# Patient Record
Sex: Female | Born: 1950 | Race: Black or African American | Hispanic: No | Marital: Married | Smoking: Former smoker
Health system: Southern US, Community
[De-identification: ages and names within clinical notes are randomized; demographics above are authoritative.]

## PROBLEM LIST (undated history)

## (undated) DIAGNOSIS — E039 Hypothyroidism, unspecified: Secondary | ICD-10-CM

## (undated) DIAGNOSIS — I82409 Acute embolism and thrombosis of unspecified deep veins of unspecified lower extremity: Secondary | ICD-10-CM

## (undated) DIAGNOSIS — T8859XA Other complications of anesthesia, initial encounter: Secondary | ICD-10-CM

## (undated) DIAGNOSIS — I10 Essential (primary) hypertension: Secondary | ICD-10-CM

## (undated) DIAGNOSIS — C801 Malignant (primary) neoplasm, unspecified: Secondary | ICD-10-CM

## (undated) DIAGNOSIS — K760 Fatty (change of) liver, not elsewhere classified: Secondary | ICD-10-CM

## (undated) DIAGNOSIS — E7800 Pure hypercholesterolemia, unspecified: Secondary | ICD-10-CM

## (undated) DIAGNOSIS — M75100 Unspecified rotator cuff tear or rupture of unspecified shoulder, not specified as traumatic: Secondary | ICD-10-CM

## (undated) DIAGNOSIS — R011 Cardiac murmur, unspecified: Secondary | ICD-10-CM

## (undated) DIAGNOSIS — J45909 Unspecified asthma, uncomplicated: Secondary | ICD-10-CM

## (undated) DIAGNOSIS — K5792 Diverticulitis of intestine, part unspecified, without perforation or abscess without bleeding: Secondary | ICD-10-CM

## (undated) DIAGNOSIS — K589 Irritable bowel syndrome without diarrhea: Secondary | ICD-10-CM

## (undated) DIAGNOSIS — K219 Gastro-esophageal reflux disease without esophagitis: Secondary | ICD-10-CM

## (undated) DIAGNOSIS — Z973 Presence of spectacles and contact lenses: Secondary | ICD-10-CM

## (undated) DIAGNOSIS — G4730 Sleep apnea, unspecified: Secondary | ICD-10-CM

## (undated) HISTORY — PX: APPENDECTOMY: SHX54

## (undated) HISTORY — PX: MULTIPLE TOOTH EXTRACTIONS: SHX2053

## (undated) HISTORY — PX: COLONOSCOPY: SHX174

---

## 2019-09-12 ENCOUNTER — Other Ambulatory Visit: Payer: Self-pay | Admitting: Family

## 2019-09-12 DIAGNOSIS — D242 Benign neoplasm of left breast: Secondary | ICD-10-CM

## 2019-09-12 DIAGNOSIS — R921 Mammographic calcification found on diagnostic imaging of breast: Secondary | ICD-10-CM

## 2019-09-12 DIAGNOSIS — N6452 Nipple discharge: Secondary | ICD-10-CM

## 2019-10-02 ENCOUNTER — Ambulatory Visit
Admission: RE | Admit: 2019-10-02 | Discharge: 2019-10-02 | Disposition: A | Discharge status: Home / Self Care | Payer: Medicare Other | Source: Ambulatory Visit | Attending: Family | Admitting: Family

## 2019-10-02 ENCOUNTER — Other Ambulatory Visit: Payer: Self-pay

## 2019-10-02 DIAGNOSIS — D242 Benign neoplasm of left breast: Secondary | ICD-10-CM

## 2019-10-02 DIAGNOSIS — R921 Mammographic calcification found on diagnostic imaging of breast: Secondary | ICD-10-CM

## 2019-10-02 DIAGNOSIS — N6452 Nipple discharge: Secondary | ICD-10-CM

## 2019-10-09 ENCOUNTER — Other Ambulatory Visit: Payer: Self-pay | Admitting: Surgery

## 2019-10-09 ENCOUNTER — Encounter: Payer: Self-pay | Admitting: Adult Health

## 2019-10-09 DIAGNOSIS — D0512 Intraductal carcinoma in situ of left breast: Secondary | ICD-10-CM

## 2019-10-09 DIAGNOSIS — D242 Benign neoplasm of left breast: Secondary | ICD-10-CM

## 2019-10-09 DIAGNOSIS — Z170 Estrogen receptor positive status [ER+]: Secondary | ICD-10-CM | POA: Insufficient documentation

## 2019-10-09 DIAGNOSIS — C50112 Malignant neoplasm of central portion of left female breast: Secondary | ICD-10-CM | POA: Insufficient documentation

## 2019-10-10 ENCOUNTER — Other Ambulatory Visit: Payer: Self-pay | Admitting: Surgery

## 2019-10-10 DIAGNOSIS — D242 Benign neoplasm of left breast: Secondary | ICD-10-CM

## 2019-10-10 DIAGNOSIS — D0512 Intraductal carcinoma in situ of left breast: Secondary | ICD-10-CM

## 2019-10-14 ENCOUNTER — Telehealth: Payer: Self-pay | Admitting: *Deleted

## 2019-10-14 NOTE — Telephone Encounter (Signed)
Spoke to pt regarding medical and radiation oncology. Pt would like to have a referral to medical and radiation oncology in [place], [place] or [place]. Msg sent to Dr. Trevor Mace nurse.

## 2019-10-15 ENCOUNTER — Telehealth: Payer: Self-pay | Admitting: *Deleted

## 2019-10-15 NOTE — Telephone Encounter (Signed)
Patient wishes to have treatment closer to home.

## 2019-10-31 NOTE — Pre-Procedure Instructions (Signed)
   Kristine Nguyen  10/31/2019      Your procedure is scheduled on Tuesday, November 05, 2019  Report to [facility] Admitting at 1:45 P.M.  Call this number if you have problems the morning of surgery:  [phone]   Remember: Brush your teeth the morning of surgery.   Do not eat after midnight the night before surgery.   You may drink clear liquids until 12:45P.M. the day of surgery. Clear liquids allowed are:  Water, Juice (non-citric and without pulp - diabetics please choose diet or no sugar options), Carbonated beverages - (diabetics please choose diet or no sugar options), Clear Tea, Black Coffee only (no creamer, milk or cream including half and half), Plain Jell-O only (diabetics please choose diet or no sugar options), Gatorade (diabetics please choose diet or no sugar options) and Plain Popsicles only Please finish you Ensure Pre-Surgery drink by 12:45P.M. (Do not sip).   Take these medicines the morning of surgery with A SIP OF WATER : amLODipine (NORVASC)  esomeprazole (NEXIUM)  levothyroxine (SYNTHROID)  metoprolol succinate (TOPROL-XL) rosuvastatin (CRESTOR)   If needed: albuterol (VENTOLIN HFA) 108 (90 Base) MCG/ACT inhaler for wheezing or shortness of breath ( Bring inhaler in with you on day of surgery)  Stop taking Aspirin (unless otherwise advised by surgeon), vitamins, fish oil and herbal medications. Do not take any NSAIDs ie: Ibuprofen, Advil, Naproxen (Aleve), Motrin, BC and Goody Powder; stop now.    Do not wear jewelry, make-up or nail polish.  Do not wear lotions, powders, or perfumes, or deodorant.  Do not shave 48 hours prior to surgery.   Do not bring valuables to the hospital.  [facility] is not responsible for any belongings or valuables.  Contacts, dentures or bridgework may not be worn into surgery.   Patients discharged the day of surgery will not be allowed to drive home.   Special instructions: See " [place] [facility] For [facility]"  sheet.   Please read over the following fact sheets that you were given. Pain Booklet, Coughing and Deep Breathing and Surgical Site Infection Prevention

## 2019-11-01 ENCOUNTER — Other Ambulatory Visit (HOSPITAL_COMMUNITY)
Admission: RE | Admit: 2019-11-01 | Discharge: 2019-11-01 | Disposition: A | Discharge status: Home / Self Care | Payer: BC Managed Care – PPO | Source: Ambulatory Visit | Attending: Surgery | Admitting: Surgery

## 2019-11-01 ENCOUNTER — Encounter (HOSPITAL_COMMUNITY): Payer: Self-pay

## 2019-11-01 ENCOUNTER — Other Ambulatory Visit: Payer: Self-pay

## 2019-11-01 ENCOUNTER — Encounter (HOSPITAL_COMMUNITY)
Admission: RE | Admit: 2019-11-01 | Discharge: 2019-11-01 | Disposition: A | Discharge status: Home / Self Care | Payer: BC Managed Care – PPO | Source: Ambulatory Visit | Attending: Surgery | Admitting: Surgery

## 2019-11-01 DIAGNOSIS — Z20822 Contact with and (suspected) exposure to covid-19: Secondary | ICD-10-CM | POA: Diagnosis not present

## 2019-11-01 DIAGNOSIS — Z01812 Encounter for preprocedural laboratory examination: Secondary | ICD-10-CM | POA: Diagnosis not present

## 2019-11-01 HISTORY — DX: Sleep apnea, unspecified: G47.30

## 2019-11-01 HISTORY — DX: Presence of spectacles and contact lenses: Z97.3

## 2019-11-01 HISTORY — DX: Fatty (change of) liver, not elsewhere classified: K76.0

## 2019-11-01 HISTORY — DX: Gastro-esophageal reflux disease without esophagitis: K21.9

## 2019-11-01 HISTORY — DX: Diverticulitis of intestine, part unspecified, without perforation or abscess without bleeding: K57.92

## 2019-11-01 HISTORY — DX: Cardiac murmur, unspecified: R01.1

## 2019-11-01 HISTORY — DX: Essential (primary) hypertension: I10

## 2019-11-01 HISTORY — DX: Hypothyroidism, unspecified: E03.9

## 2019-11-01 HISTORY — DX: Malignant (primary) neoplasm, unspecified: C80.1

## 2019-11-01 HISTORY — DX: Acute embolism and thrombosis of unspecified deep veins of unspecified lower extremity: I82.409

## 2019-11-01 HISTORY — DX: Other complications of anesthesia, initial encounter: T88.59XA

## 2019-11-01 HISTORY — DX: Unspecified asthma, uncomplicated: J45.909

## 2019-11-01 HISTORY — DX: Unspecified rotator cuff tear or rupture of unspecified shoulder, not specified as traumatic: M75.100

## 2019-11-01 HISTORY — DX: Pure hypercholesterolemia, unspecified: E78.00

## 2019-11-01 HISTORY — DX: Irritable bowel syndrome, unspecified: K58.9

## 2019-11-01 HISTORY — DX: Morbid (severe) obesity due to excess calories: E66.01

## 2019-11-01 LAB — BASIC METABOLIC PANEL
Anion gap: 10 (ref 5–15)
BUN: 21 mg/dL (ref 8–23)
CO2: 25 mmol/L (ref 22–32)
Calcium: 9.4 mg/dL (ref 8.9–10.3)
Chloride: 104 mmol/L (ref 98–111)
Creatinine, Ser: 1.14 mg/dL — ABNORMAL HIGH (ref 0.44–1.00)
GFR calc Af Amer: 57 mL/min — ABNORMAL LOW (ref >60)
GFR calc non Af Amer: 49 mL/min — ABNORMAL LOW (ref >60)
Glucose, Bld: 101 mg/dL — ABNORMAL HIGH (ref 70–99)
Potassium: 4 mmol/L (ref 3.5–5.1)
Sodium: 139 mmol/L (ref 135–145)

## 2019-11-01 LAB — CBC
HCT: 41.8 % (ref 36.0–46.0)
Hemoglobin: 13 g/dL (ref 12.0–15.0)
MCH: 28.1 pg (ref 26.0–34.0)
MCHC: 31.1 g/dL (ref 30.0–36.0)
MCV: 90.3 fL (ref 80.0–100.0)
Platelets: 292 10*3/uL (ref 150–400)
RBC: 4.63 MIL/uL (ref 3.87–5.11)
RDW: 15.5 % (ref 11.5–15.5)
WBC: 8.5 10*3/uL (ref 4.0–10.5)
nRBC: 0 % (ref 0.0–0.2)

## 2019-11-01 LAB — NO BLOOD PRODUCTS

## 2019-11-01 LAB — SARS CORONAVIRUS 2 (TAT 6-24 HRS): SARS Coronavirus 2: NEGATIVE

## 2019-11-01 NOTE — Progress Notes (Signed)
Pt denies having a cardiac cath. Pt denies having a chest x ray. Pt denies recent labs.

## 2019-11-01 NOTE — Progress Notes (Signed)
Pt denies any acute pulmonary issues. Pt denies having chest pain. Pt stated that she is under the care of Dr. Tresa Endo, PCP, Estanislado Pandy, NP, Cardiology and Dr. Clyda Hurdle, Pulmonology. Pt stated that Dr. Thomasene Mohair performed her sleep study. Nurse requested sleep study results and LOV notes ; awaiting response. Nurse requested EKG tracing and all cardiac studies from cardiologist; awaiting response. Pt stated that she is scheduled for an echo next month.  Pt made aware to continue to quarantine. Pt verbalized understanding of all pre-op instructions. PA, Anesthesiology, asked to review pt history.

## 2019-11-02 ENCOUNTER — Other Ambulatory Visit (HOSPITAL_COMMUNITY): Payer: Medicare Other

## 2019-11-04 MED ORDER — DEXTROSE 5 % IV SOLN
3.0000 g | INTRAVENOUS | Status: AC
  Administered 2019-11-05: 3 g via INTRAVENOUS
  Filled 2019-11-04: qty 3

## 2019-11-04 NOTE — Anesthesia Preprocedure Evaluation (Addendum)
Anesthesia Evaluation  Patient identified by MRN, date of birth, ID band Patient awake    Reviewed: Allergy & Precautions, NPO status , Patient's Chart, lab work & pertinent test results, reviewed documented beta blocker date and time   History of Anesthesia Complications Negative for: history of anesthetic complications  Airway Mallampati: II  TM Distance: >3 FB Neck ROM: Full    Dental  (+) Dental Advisory Given, Missing,    Pulmonary asthma , sleep apnea and Continuous Positive Airway Pressure Ventilation , former smoker,  Last used albuterol a few weeks ago Quit smoking 40 years ago   Pulmonary exam normal breath sounds clear to auscultation       Cardiovascular hypertension, Pt. on medications and Pt. on home beta blockers + DVT  Normal cardiovascular exam Rhythm:Regular Rate:Normal  HLD  EKG 08/19/19 (copy on chart): Sinus bradycardia. Rate 49. LAFB. Low voltage precordial leads. Diffuse nonspecific T abn.  Cardiac Event Monitor 08/22/2017-09/04/2017 Normal Sinus Rhythm, No SVT. No Ventricular arrhythmias. No pauses. Average HR 57, Maximum HR 107, Minimum HR 41.  Echo 08/26/16 1. Resting bradycardia (HR<60bpm).  2. The left ventricle is mildly dilated.  3. Overall left ventricular systolic function is normal with EF 60 - 65 %.  4. The left atrium is mildly dilated.  5. There is trace mitral regurgitation.  6. Mild tricuspid regurgitation present.  7. The right ventricular systolic pressure, as measured by Doppler, is 35.59mmHg.   US Carotid 08/26/16 IMPRESSION: Negative for carotid artery stenosis.   Echocardiogram 01/30/13 Technical difficult study 2. Left ventricle mildly dilated 3. Overall Left ventricular systolic function is normal with EF between 55-60% 4. Diastolic filling pattern normal for age of pt. 5. Left atrium is moderately dilated 6. Trace Mitral regurgitation  Nuc Stress Test 01/30/13 1.  Myocardial imaging consistent with artifact  2. LVEF is normal calcualted at 69%, Wall motion is normal 3. Low risk Test    Neuro/Psych negative neurological ROS  negative psych ROS   GI/Hepatic Neg liver ROS, GERD  Medicated and Controlled,  Endo/Other  Hypothyroidism Morbid obesity (BMI 62)  Renal/GU negative Renal ROS  negative genitourinary   Musculoskeletal negative musculoskeletal ROS (+)   Abdominal (+) + obese,   Peds  Hematology negative hematology ROS (+)   Anesthesia Other Findings Left breast CA  Reproductive/Obstetrics                          Anesthesia Physical Anesthesia Plan  ASA: III  Anesthesia Plan: General   Post-op Pain Management:    Induction: Intravenous  PONV Risk Score and Plan: 3 and Ondansetron, Dexamethasone and Midazolam  Airway Management Planned: LMA  Additional Equipment: None  Intra-op Plan:   Post-operative Plan: Extubation in OR  Informed Consent: I have reviewed the patients History and Physical, chart, labs and discussed the procedure including the risks, benefits and alternatives for the proposed anesthesia with the patient or authorized representative who has indicated his/her understanding and acceptance.     Dental advisory given  Plan Discussed with: CRNA  Anesthesia Plan Comments:       Anesthesia Quick Evaluation

## 2019-11-04 NOTE — H&P (Signed)
Kristine Nguyen Appointment: 10/09/2019 2:50 PM Location: [facility] Patient #: [id number] DOB: Oct 08, 1950 Married / Language: English / Race: Black or African American Female   History of Present Illness ([name] A. [name] MD; 10/09/2019 2:31 PM) The patient is a 69 year old female who presents with breast cancer.  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 2 areas of femoral screening mammography which were biopsied. One showed low-grade DCIS which was 100% ER/PR positive. The other showed a papilloma. She denies nipple discharge. She has had no previous history of breast biopsies. Her family history is negative for breast cancer. She is otherwise without complaints.   Diagnostic Studies History ([name], [name]; 10/09/2019 2:05 PM) Colonoscopy  1-5 years ago Mammogram  within last year  Allergies ([name], [name]; 10/09/2019 2:06 PM) Advil *ANALGESICS - ANTI-INFLAMMATORY*  Aleve *ANALGESICS - ANTI-INFLAMMATORY*  Ibuprofen & Diet Manage Prod *ANALGESICS - ANTI-INFLAMMATORY*  Allergies Reconciled   Medication History ([name], CMA; 10/09/2019 2:07 PM) Rosuvastatin Calcium (20MG  Tablet, Oral) Active. Metoprolol Succinate ER (25MG  Tablet ER 24HR, Oral) Active. Levothyroxine Sodium (100MCG Tablet, Oral) Active. Lisinopril-hydroCHLOROthiazide (20-25MG  Tablet, Oral) Active. amLODIPine Besylate (10MG  Tablet, Oral) Active. Aspirin (81MG  Tablet, Oral) Active. Medications Reconciled  Social History [name], CMA; 10/09/2019 2:05 PM) No alcohol use  No caffeine use  No drug use  Tobacco use  Never smoker.  Family History [name], [name]; 10/09/2019 2:05 PM) Arthritis  Father, Mother. Diabetes Mellitus  Mother. Heart Disease  Mother. Hypertension  Mother. Thyroid problems   Brother.  Pregnancy / Birth History [name], [place]; 10/09/2019 2:05 PM) Age at menarche  78 years. Age of menopause  36-55 Gravida  2 Maternal age  81-25 Para  2  Other Problems ([name], [name]; 10/09/2019 2:05 PM) Arthritis  Asthma  Colon Cancer  Gastroesophageal Reflux Disease  Hypercholesterolemia  Sleep Apnea  Thyroid Disease     Review of Systems ([name] CMA; 10/09/2019 2:05 PM) General Not Present- Appetite Loss, Chills, Fatigue, Fever, Night Sweats, Weight Gain and Weight Loss. Skin Not Present- Change in Wart/Mole, Dryness, Hives, Jaundice, New Lesions, Non-Healing Wounds, Rash and Ulcer. HEENT Not Present- Earache, Hearing Loss, Hoarseness, Nose Bleed, Oral Ulcers, Ringing in the Ears, Seasonal Allergies, Sinus Pain, Sore Throat, Visual Disturbances, Wears glasses/contact lenses and Yellow Eyes. Respiratory Not Present- Bloody sputum, Chronic Cough, Difficulty Breathing, Snoring and Wheezing. Breast Not Present- Breast Mass, Breast Pain, Nipple Discharge and Skin Changes. Cardiovascular Not Present- Chest Pain, Difficulty Breathing Lying Down, Leg Cramps, Palpitations, Rapid Heart Rate, Shortness of Breath and Swelling of Extremities. Gastrointestinal Not Present- Abdominal Pain, Bloating, Bloody Stool, Change in Bowel Habits, Chronic diarrhea, Constipation, Difficulty Swallowing, Excessive gas, Gets full quickly at meals, Hemorrhoids, Indigestion, Nausea, Rectal Pain and Vomiting. Female Genitourinary Not Present- Frequency, Nocturia, Painful Urination, Pelvic Pain and Urgency. Musculoskeletal Not Present- Back Pain, Joint Pain, Joint Stiffness, Muscle Pain, Muscle Weakness and Swelling of Extremities. Neurological Not Present- Decreased Memory, Fainting, Headaches, Numbness, Seizures, Tingling, Tremor, Trouble walking and Weakness. Psychiatric Not Present- Anxiety, Bipolar, Change in Sleep Pattern, Depression, Fearful and Frequent crying. Endocrine Not  Present- Cold Intolerance, Excessive Hunger, Hair Changes, Heat Intolerance, Hot flashes and New Diabetes. Hematology Not Present- Blood Thinners, Easy Bruising, Excessive bleeding, Gland problems, HIV and Persistent Infections.  Vitals ([name] CMA; 10/09/2019 2:07 PM) 10/09/2019 2:07 PM Weight: 348.38  lb Height: 63in Body Surface Area: 2.45 m Body Mass Index: 61.71 kg/m  Temp.: 97.9F  Pulse: 68 (Regular)        Physical Exam ([name] A. [name] MD; 10/09/2019 2:31 PM) The physical exam findings are as follows: Note: Generally she is well appearance  Breasts are examined bilaterally. There is a small palpable mass underneath the areola from a hematoma of the left breast near the biopsy site. There are no other masses in either breast and no axillary adenopathy.    Assessment & Plan (Danyel Tobey A. [name] MD; 10/09/2019 2:33 PM) DUCTAL CARCINOMA IN SITU (DCIS) OF LEFT BREAST (D05.12) Impression: I have reviewed the patient's mammogram, ultrasound, and pathology results. She was also discussed in our multidisciplinary breast cancer conference. I gave the patient and her husband copy of the pathology results. We discussed these in detail. We discussed breast cancer in detail. We discussed breast conservation versus mastectomy. We discussed postoperative radiation. We discussed long-term results of each. At this point, a radioactive seed guided left breast lumpectomy to remove the DCIS and the papilloma from the left breast is recommended. I discussed the surgical procedure in detail. I discussed the risk which includes but is not limited to bleeding, infection, injury to surrounding structures, the need for further surgery if margins are positive, cardiopulmonary issues, postoperative recovery, etc. After long discussion, she wished to proceed with breast conservation. Surgery will be scheduled. We will also refer to both medical and radiation oncology.

## 2019-11-04 NOTE — Progress Notes (Signed)
Anesthesia Chart Review:  Pt follows with cardiology in [place] for hx of stable DOE and risk factor modification. She was last seen 10/23/19 for recheck and at that time was reportedly doing well, no change to baseline DOE, no chest pain. Upcoming breast surgery was discussed. Routine followup echo was ordered - per note to monitor MR which was only trace by echo 2018. It does not appear echo has been done yet.  Morbid obesity with BMI 62.  Hx of asthma. Pt reports hx of perioperative asthma attack.  OSA on CPAP, followed by pulmonology.  Preop labs reviewed, unremarkable.  EKG 08/19/19 (copy on chart): Sinus bradycardia. Rate 49. LAFB. Low voltage precordial leads. Diffuse nonspecific T abn.  Cardiac Event Monitor 08/22/2017-09/04/2017 Normal Sinus Rhythm, No SVT. No Ventricular arrhythmias. No pauses. Average HR 57, Maximum HR 107, Minimum HR 41.  Echo 08/26/16 1. Resting bradycardia (HR<60bpm).  2. The left ventricle is mildly dilated.  3. Overall left ventricular systolic function is normal with EF 60 - 65 %.  4. The left atrium is mildly dilated.  5. There is trace mitral regurgitation.  6. Mild tricuspid regurgitation present.  7. The right ventricular systolic pressure, as measured by Doppler, is 35.29mmHg.   US Carotid 08/26/16 IMPRESSION: Negative for carotid artery stenosis.   Echocardiogram 01/30/13 Technical difficult study 2. Left ventricle mildly dilated 3. Overall Left ventricular systolic function is normal with EF between 55-60% 4. Diastolic filling pattern normal for age of pt. 5. Left atrium is moderately dilated 6. Trace Mitral regurgitation  Nuc Stress Test 01/30/13 1. Myocardial imaging consistent with artifact  2. LVEF is normal calcualted at 69%, Wall motion is normal 3. Low risk Test

## 2019-11-05 ENCOUNTER — Ambulatory Visit (HOSPITAL_COMMUNITY): Payer: BC Managed Care – PPO | Admitting: Anesthesiology

## 2019-11-05 ENCOUNTER — Ambulatory Visit (HOSPITAL_COMMUNITY)
Admission: RE | Admit: 2019-11-05 | Discharge: 2019-11-05 | Disposition: A | Discharge status: Home / Self Care | Payer: BC Managed Care – PPO | Source: Ambulatory Visit | Attending: Surgery | Admitting: Surgery

## 2019-11-05 ENCOUNTER — Other Ambulatory Visit: Payer: Self-pay

## 2019-11-05 ENCOUNTER — Ambulatory Visit
Admission: RE | Admit: 2019-11-05 | Discharge: 2019-11-05 | Disposition: A | Discharge status: Home / Self Care | Payer: BC Managed Care – PPO | Source: Ambulatory Visit | Attending: Surgery | Admitting: Surgery

## 2019-11-05 ENCOUNTER — Ambulatory Visit
Admit: 2019-11-05 | Discharge: 2019-11-05 | Disposition: A | Discharge status: Home / Self Care | Payer: BC Managed Care – PPO | Attending: Surgery | Admitting: Surgery

## 2019-11-05 ENCOUNTER — Encounter (HOSPITAL_COMMUNITY)
Admission: RE | Disposition: A | Discharge status: Home / Self Care | Payer: Self-pay | Source: Ambulatory Visit | Attending: Surgery

## 2019-11-05 ENCOUNTER — Encounter (HOSPITAL_COMMUNITY): Payer: Self-pay | Admitting: Surgery

## 2019-11-05 ENCOUNTER — Ambulatory Visit (HOSPITAL_COMMUNITY): Payer: BC Managed Care – PPO | Admitting: Vascular Surgery

## 2019-11-05 DIAGNOSIS — I10 Essential (primary) hypertension: Secondary | ICD-10-CM | POA: Insufficient documentation

## 2019-11-05 DIAGNOSIS — J45909 Unspecified asthma, uncomplicated: Secondary | ICD-10-CM | POA: Insufficient documentation

## 2019-11-05 DIAGNOSIS — Z86718 Personal history of other venous thrombosis and embolism: Secondary | ICD-10-CM | POA: Insufficient documentation

## 2019-11-05 DIAGNOSIS — Z6841 Body Mass Index (BMI) 40.0 and over, adult: Secondary | ICD-10-CM | POA: Insufficient documentation

## 2019-11-05 DIAGNOSIS — Z87891 Personal history of nicotine dependence: Secondary | ICD-10-CM | POA: Diagnosis not present

## 2019-11-05 DIAGNOSIS — Z85038 Personal history of other malignant neoplasm of large intestine: Secondary | ICD-10-CM | POA: Insufficient documentation

## 2019-11-05 DIAGNOSIS — D0512 Intraductal carcinoma in situ of left breast: Secondary | ICD-10-CM

## 2019-11-05 DIAGNOSIS — D242 Benign neoplasm of left breast: Secondary | ICD-10-CM

## 2019-11-05 DIAGNOSIS — E7800 Pure hypercholesterolemia, unspecified: Secondary | ICD-10-CM | POA: Diagnosis not present

## 2019-11-05 DIAGNOSIS — Z7982 Long term (current) use of aspirin: Secondary | ICD-10-CM | POA: Diagnosis not present

## 2019-11-05 DIAGNOSIS — Z79899 Other long term (current) drug therapy: Secondary | ICD-10-CM | POA: Diagnosis not present

## 2019-11-05 DIAGNOSIS — Z79890 Hormone replacement therapy (postmenopausal): Secondary | ICD-10-CM | POA: Insufficient documentation

## 2019-11-05 DIAGNOSIS — K219 Gastro-esophageal reflux disease without esophagitis: Secondary | ICD-10-CM | POA: Insufficient documentation

## 2019-11-05 DIAGNOSIS — G4730 Sleep apnea, unspecified: Secondary | ICD-10-CM | POA: Insufficient documentation

## 2019-11-05 DIAGNOSIS — Z170 Estrogen receptor positive status [ER+]: Secondary | ICD-10-CM | POA: Diagnosis not present

## 2019-11-05 DIAGNOSIS — D0592 Unspecified type of carcinoma in situ of left breast: Secondary | ICD-10-CM | POA: Diagnosis not present

## 2019-11-05 DIAGNOSIS — E039 Hypothyroidism, unspecified: Secondary | ICD-10-CM | POA: Insufficient documentation

## 2019-11-05 HISTORY — PX: BREAST LUMPECTOMY WITH RADIOACTIVE SEED LOCALIZATION: SHX6424

## 2019-11-05 SURGERY — BREAST LUMPECTOMY WITH RADIOACTIVE SEED LOCALIZATION
Anesthesia: General | Site: Breast | Laterality: Left

## 2019-11-05 MED ORDER — PROPOFOL 10 MG/ML IV BOLUS
INTRAVENOUS | Status: AC
  Filled 2019-11-05: qty 40

## 2019-11-05 MED ORDER — LACTATED RINGERS IV SOLN: INTRAVENOUS | Status: DC

## 2019-11-05 MED ORDER — DEXAMETHASONE SODIUM PHOSPHATE 4 MG/ML IJ SOLN
INTRAMUSCULAR | Status: DC | PRN
  Administered 2019-11-05: 10 mg via INTRAVENOUS

## 2019-11-05 MED ORDER — SUGAMMADEX SODIUM 200 MG/2ML IV SOLN
INTRAVENOUS | Status: DC | PRN
Start: 2019-11-05 — End: 2019-11-05
  Administered 2019-11-05: 300 mg via INTRAVENOUS

## 2019-11-05 MED ORDER — CHLORHEXIDINE GLUCONATE 0.12 % MT SOLN
15.0000 mL | Freq: Once | OROMUCOSAL | Status: AC
  Administered 2019-11-05: 15 mL via OROMUCOSAL
  Filled 2019-11-05: qty 15

## 2019-11-05 MED ORDER — FENTANYL CITRATE (PF) 250 MCG/5ML IJ SOLN
INTRAMUSCULAR | Status: AC
  Filled 2019-11-05: qty 5

## 2019-11-05 MED ORDER — EPHEDRINE SULFATE 50 MG/ML IJ SOLN
INTRAMUSCULAR | Status: DC | PRN
Start: 2019-11-05 — End: 2019-11-05
  Administered 2019-11-05: 5 mg via INTRAVENOUS

## 2019-11-05 MED ORDER — ORAL CARE MOUTH RINSE: 15.0000 mL | Freq: Once | OROMUCOSAL | Status: AC

## 2019-11-05 MED ORDER — ACETAMINOPHEN 500 MG PO TABS: 1000.0000 mg | ORAL_TABLET | ORAL | Status: AC

## 2019-11-05 MED ORDER — MIDAZOLAM HCL 5 MG/5ML IJ SOLN
INTRAMUSCULAR | Status: DC | PRN
  Administered 2019-11-05: 2 mg via INTRAVENOUS

## 2019-11-05 MED ORDER — FENTANYL CITRATE (PF) 100 MCG/2ML IJ SOLN
INTRAMUSCULAR | Status: AC
  Filled 2019-11-05: qty 2

## 2019-11-05 MED ORDER — MIDAZOLAM HCL 2 MG/2ML IJ SOLN
INTRAMUSCULAR | Status: AC
  Filled 2019-11-05: qty 2

## 2019-11-05 MED ORDER — GABAPENTIN 300 MG PO CAPS
ORAL_CAPSULE | ORAL | Status: AC
  Administered 2019-11-05: 300 mg via ORAL
  Filled 2019-11-05: qty 1

## 2019-11-05 MED ORDER — PROPOFOL 10 MG/ML IV BOLUS
INTRAVENOUS | Status: DC | PRN
  Administered 2019-11-05: 150 mg via INTRAVENOUS
  Administered 2019-11-05: 50 mg via INTRAVENOUS

## 2019-11-05 MED ORDER — 0.9 % SODIUM CHLORIDE (POUR BTL) OPTIME
TOPICAL | Status: DC | PRN
  Administered 2019-11-05: 1000 mL

## 2019-11-05 MED ORDER — ONDANSETRON HCL 4 MG/2ML IJ SOLN
INTRAMUSCULAR | Status: DC | PRN
  Administered 2019-11-05: 4 mg via INTRAVENOUS

## 2019-11-05 MED ORDER — CHLORHEXIDINE GLUCONATE CLOTH 2 % EX PADS: 6.0000 | MEDICATED_PAD | Freq: Once | CUTANEOUS | Status: DC

## 2019-11-05 MED ORDER — ROCURONIUM BROMIDE 10 MG/ML (PF) SYRINGE
PREFILLED_SYRINGE | INTRAVENOUS | Status: DC | PRN
  Administered 2019-11-05: 30 mg via INTRAVENOUS

## 2019-11-05 MED ORDER — TRAMADOL HCL 50 MG PO TABS: 50.0000 mg | ORAL_TABLET | Freq: Four times a day (QID) | ORAL | 0 refills | Status: AC | PRN

## 2019-11-05 MED ORDER — ACETAMINOPHEN 500 MG PO TABS: 1000.0000 mg | ORAL_TABLET | Freq: Once | ORAL | Status: DC

## 2019-11-05 MED ORDER — BUPIVACAINE HCL (PF) 0.25 % IJ SOLN
INTRAMUSCULAR | Status: AC
  Filled 2019-11-05: qty 30

## 2019-11-05 MED ORDER — SUCCINYLCHOLINE CHLORIDE 20 MG/ML IJ SOLN
INTRAMUSCULAR | Status: DC | PRN
  Administered 2019-11-05: 160 mg via INTRAVENOUS

## 2019-11-05 MED ORDER — ACETAMINOPHEN 500 MG PO TABS
ORAL_TABLET | ORAL | Status: AC
  Administered 2019-11-05: 1000 mg via ORAL
  Filled 2019-11-05: qty 2

## 2019-11-05 MED ORDER — ALBUTEROL SULFATE HFA 108 (90 BASE) MCG/ACT IN AERS
INHALATION_SPRAY | RESPIRATORY_TRACT | Status: DC | PRN
  Administered 2019-11-05: 6 via RESPIRATORY_TRACT

## 2019-11-05 MED ORDER — FENTANYL CITRATE (PF) 100 MCG/2ML IJ SOLN
25.0000 ug | INTRAMUSCULAR | Status: DC | PRN
  Administered 2019-11-05 (×2): 25 ug via INTRAVENOUS

## 2019-11-05 MED ORDER — GABAPENTIN 300 MG PO CAPS: 300.0000 mg | ORAL_CAPSULE | ORAL | Status: AC

## 2019-11-05 MED ORDER — BUPIVACAINE HCL (PF) 0.25 % IJ SOLN
INTRAMUSCULAR | Status: DC | PRN
  Administered 2019-11-05: 20 mL

## 2019-11-05 MED ORDER — FENTANYL CITRATE (PF) 100 MCG/2ML IJ SOLN
INTRAMUSCULAR | Status: DC | PRN
  Administered 2019-11-05 (×5): 50 ug via INTRAVENOUS

## 2019-11-05 MED ORDER — LIDOCAINE 2% (20 MG/ML) 5 ML SYRINGE
INTRAMUSCULAR | Status: DC | PRN
  Administered 2019-11-05: 40 mg via INTRAVENOUS

## 2019-11-05 SURGICAL SUPPLY — 31 items
APPLIER CLIP 9.375 MED OPEN (MISCELLANEOUS) ×2
BINDER BREAST LRG (GAUZE/BANDAGES/DRESSINGS) IMPLANT
BINDER BREAST XLRG (GAUZE/BANDAGES/DRESSINGS) IMPLANT
CANISTER SUCT 3000ML PPV (MISCELLANEOUS) ×2 IMPLANT
CHLORAPREP W/TINT 26 (MISCELLANEOUS) ×2 IMPLANT
CLIP APPLIE 9.375 MED OPEN (MISCELLANEOUS) ×1 IMPLANT
COVER PROBE W GEL 5X96 (DRAPES) ×2 IMPLANT
COVER SURGICAL LIGHT HANDLE (MISCELLANEOUS) ×2 IMPLANT
COVER WAND RF STERILE (DRAPES) ×2 IMPLANT
DERMABOND ADVANCED (GAUZE/BANDAGES/DRESSINGS) ×1
DERMABOND ADVANCED .7 DNX12 (GAUZE/BANDAGES/DRESSINGS) ×1 IMPLANT
DEVICE DUBIN SPECIMEN MAMMOGRA (MISCELLANEOUS) ×2 IMPLANT
DRAPE CHEST BREAST 15X10 FENES (DRAPES) ×2 IMPLANT
ELECT CAUTERY BLADE 6.4 (BLADE) ×2 IMPLANT
ELECT REM PT RETURN 9FT ADLT (ELECTROSURGICAL) ×2
ELECTRODE REM PT RTRN 9FT ADLT (ELECTROSURGICAL) ×1 IMPLANT
GLOVE SURG SIGNA 7.5 PF LTX (GLOVE) ×2 IMPLANT
GOWN STRL REUS W/ TWL LRG LVL3 (GOWN DISPOSABLE) ×1 IMPLANT
GOWN STRL REUS W/ TWL XL LVL3 (GOWN DISPOSABLE) ×1 IMPLANT
GOWN STRL REUS W/TWL LRG LVL3 (GOWN DISPOSABLE) ×1
GOWN STRL REUS W/TWL XL LVL3 (GOWN DISPOSABLE) ×1
KIT BASIN OR (CUSTOM PROCEDURE TRAY) ×2 IMPLANT
KIT MARKER MARGIN INK (KITS) ×2 IMPLANT
NEEDLE HYPO 25GX1X1/2 BEV (NEEDLE) ×2 IMPLANT
NS IRRIG 1000ML POUR BTL (IV SOLUTION) IMPLANT
PACK GENERAL/GYN (CUSTOM PROCEDURE TRAY) ×2 IMPLANT
SUT MNCRL AB 4-0 PS2 18 (SUTURE) ×2 IMPLANT
SUT VIC AB 3-0 SH 18 (SUTURE) ×2 IMPLANT
SYR CONTROL 10ML LL (SYRINGE) ×2 IMPLANT
TOWEL GREEN STERILE (TOWEL DISPOSABLE) ×2 IMPLANT
TOWEL GREEN STERILE FF (TOWEL DISPOSABLE) ×2 IMPLANT

## 2019-11-05 NOTE — Op Note (Signed)
LEFT BREAST LUMPECTOMY X 2 WITH RADIOACTIVE SEED LOCALIZATION  Procedure Note  Kristine Nguyen 11/05/2019   Pre-op Diagnosis: LEFT BREAST DUCTAL CARCINOMA IN SITU, LEFT BREAST PAPILLOMA     Post-op Diagnosis: same  Procedure(s): LEFT BREAST LUMPECTOMY X 2 WITH RADIOACTIVE SEED LOCALIZATION  Surgeon(s): Coralie Keens, MD  Anesthesia: General  Staff:  Circulator: Rozell Searing, RN; [name], RN Scrub Person: Lois Huxley  Estimated Blood Loss: Minimal               Specimens: sent to path  Indication: This is a 69 year old female who was found on screening mammography to have 2 abnormal areas in the left breast.  An area at the 9 o'clock position of the left breast was biopsied showing ductal carcinoma in situ.  Another area at the 6 o'clock position was biopsied showing a papilloma.  The decision was made to proceed with a radioactive seed guided lumpectomy x2  Procedure: The patient was brought to operating identifies correct patient.  She is placed upon the operating table general anesthesia was induced.  Her left breast was prepped and draped in usual sterile fashion.  I localized both radioactive seeds with the neoprobe at the 9:00 and 6 o'clock position of the breast.  I anesthetized skin around the medial edge of the areola with Marcaine and made a circumareolar incision with a scalpel.  I first elected to take out the seed at the 6 o'clock position where the papilloma is located.  The seed was medial and anterior to the papilloma.  I excised the area and started with the cautery.  The seed actually fell out and was removed separately.  I then completed the lumpectomy with the cautery.  I marked the margins with paint and x-rayed the specimen confirming that the previous tissue biopsy clip was in the specimen.  This was sent to pathology again labeled as the 6:00 papilloma.  I next identified the radioactive seed at the 9 o'clock position medially in the left breast  with the neoprobe.  I dissected toward this area with the aid of electrocautery and then again performed a wide lumpectomy staying around the specimen with the aid of the neoprobe.  I did take this down to the chest wall.  Once the specimen was removed I marked with paint and then x-rayed the specimen confirming the radioactive seed and previous marker were in the specimen as well.  It was also sent to pathology.  I achieved hemostasis with cautery.  I placed surgical clips in the area of the lumpectomy with DCIS was involved.  I anesthetized the incisions further.  I then closed subcutaneous tissue with interrupted 3-0 Vicryl sutures and closed the skin with a running 4-0 Monocryl.  Dermabond was then applied.  The patient tolerated the procedure well.  All the counts were correct at the end of the procedure.  The patient was then extubated in the operating room and taken in stable addition to the recovery room.          Coralie Keens   Date: 11/05/2019  Time: 3:11 PM

## 2019-11-05 NOTE — Transfer of Care (Signed)
Immediate Anesthesia Transfer of Care Note  Patient: Kristine Nguyen  Procedure(s) Performed: LEFT BREAST LUMPECTOMY X 2 WITH RADIOACTIVE SEED LOCALIZATION (Left Breast)  Patient Location: PACU  Anesthesia Type:General  Level of Consciousness: awake, alert  and oriented  Airway & Oxygen Therapy: Patient Spontanous Breathing and Patient connected to nasal cannula oxygen  Post-op Assessment: Report given to RN and Post -op Vital signs reviewed and stable  Post vital signs: Reviewed and stable  Last Vitals:  Vitals Value Taken Time  BP 125/74 11/05/19 1522  Temp    Pulse 65 11/05/19 1523  Resp 19 11/05/19 1525  SpO2 95 % 11/05/19 1523  Vitals shown include unvalidated device data.  Last Pain:  Vitals:   11/05/19 1344  TempSrc:   PainSc: 0-No pain      Patients Stated Pain Goal: 4 (04/29/40 1464)  Complications: No complications documented.

## 2019-11-05 NOTE — Interval H&P Note (Signed)
History and Physical Interval Note:no change in H and P  11/05/2019 1:52 PM  Kristine Nguyen  has presented today for surgery, with the diagnosis of LEFT BREAST DUCTAL CARCINOMA IN SITU, LEFT BREAST PAPILLOMA.  The various methods of treatment have been discussed with the patient and family. After consideration of risks, benefits and other options for treatment, the patient has consented to  Procedure(s) with comments: LEFT BREAST LUMPECTOMY X 2 WITH RADIOACTIVE SEED LOCALIZATION (Left) - LMA as a surgical intervention.  The patient's history has been reviewed, patient examined, no change in status, stable for surgery.  I have reviewed the patient's chart and labs.  Questions were answered to the patient's satisfaction.     Coralie Keens

## 2019-11-05 NOTE — Discharge Instructions (Signed)
[facility] Office Phone Number [phone]  BREAST BIOPSY/ PARTIAL MASTECTOMY: POST OP INSTRUCTIONS  Always review your discharge instruction sheet given to you by the facility where your surgery was performed.  IF YOU HAVE DISABILITY OR FAMILY LEAVE FORMS, YOU MUST BRING THEM TO THE OFFICE FOR PROCESSING.  DO NOT GIVE THEM TO YOUR DOCTOR.  1. A prescription for pain medication may be given to you upon discharge.  Take your pain medication as prescribed, if needed.  If narcotic pain medicine is not needed, then you may take acetaminophen (Tylenol) or ibuprofen (Advil) as needed. 2. Take your usually prescribed medications unless otherwise directed 3. If you need a refill on your pain medication, please contact your pharmacy.  They will contact our office to request authorization.  Prescriptions will not be filled after 5pm or on week-ends. 4. You should eat very light the first 24 hours after surgery, such as soup, crackers, pudding, etc.  Resume your normal diet the day after surgery. 5. Most patients will experience some swelling and bruising in the breast.  Ice packs and a good support bra will help.  Swelling and bruising can take several days to resolve.  6. It is common to experience some constipation if taking pain medication after surgery.  Increasing fluid intake and taking a stool softener will usually help or prevent this problem from occurring.  A mild laxative (Milk of Magnesia or Miralax) should be taken according to package directions if there are no bowel movements after 48 hours. 7. Unless discharge instructions indicate otherwise, you may remove your bandages 24-48 hours after surgery, and you may shower at that time.  You may have steri-strips (small skin tapes) in place directly over the incision.  These strips should be left on the skin for 7-10 days.  If your surgeon used skin glue on the incision, you may shower in 24 hours.  The glue will flake off over the  next 2-3 weeks.  Any sutures or staples will be removed at the office during your follow-up visit. 8. ACTIVITIES:  You may resume regular daily activities (gradually increasing) beginning the next day.  Wearing a good support bra or sports bra minimizes pain and swelling.  You may have sexual intercourse when it is comfortable. a. You may drive when you no longer are taking prescription pain medication, you can comfortably wear a seatbelt, and you can safely maneuver your car and apply brakes. b. RETURN TO WORK:  ______________________________________________________________________________________ 9. You should see your doctor in the office for a follow-up appointment approximately two weeks after your surgery.  Your doctor's nurse will typically make your follow-up appointment when she calls you with your pathology report.  Expect your pathology report 2-3 business days after your surgery.  You may call to check if you do not hear from us after three days. 10. OTHER INSTRUCTIONS:OK TO SHOWER STARTING TOMORROW 11. ICE PACK, TYLENOL ALSO FOR PAIN 12. NO VIGOROUS ACTIVITY FOR ONE WEEK _______________________________________________________________________________________________ _____________________________________________________________________________________________________________________________________ _____________________________________________________________________________________________________________________________________ _____________________________________________________________________________________________________________________________________  WHEN TO CALL YOUR DOCTOR: 1. Fever over 101.0 2. Nausea and/or vomiting. 3. Extreme swelling or bruising. 4. Continued bleeding from incision. 5. Increased pain, redness, or drainage from the incision.  The clinic staff is available to answer your questions during regular business hours.  Please don't hesitate to call and ask to  speak to one of the nurses for clinical concerns.  If you have a medical emergency, go to the nearest emergency room or call 911.  A surgeon from   [facility] is always on call at the hospital.  For further questions, please visit [email or web address]

## 2019-11-05 NOTE — Anesthesia Procedure Notes (Signed)
Procedure Name: Intubation Date/Time: 11/05/2019 2:27 PM Performed by: Griffin Dakin, CRNA Pre-anesthesia Checklist: Patient identified, Emergency Drugs available, Suction available and Patient being monitored Patient Re-evaluated:Patient Re-evaluated prior to induction Oxygen Delivery Method: Circle system utilized Preoxygenation: Pre-oxygenation with 100% oxygen Induction Type: IV induction Ventilation: Mask ventilation without difficulty and Oral airway inserted - appropriate to patient size Laryngoscope Size: Mac and 3 Grade View: Grade I Tube type: Oral Tube size: 7.5 mm Number of attempts: 1 Airway Equipment and Method: Stylet and Oral airway Placement Confirmation: ETT inserted through vocal cords under direct vision,  positive ETCO2 and breath sounds checked- equal and bilateral Secured at: 23 cm Tube secured with: Tape Dental Injury: Teeth and Oropharynx as per pre-operative assessment

## 2019-11-05 NOTE — Anesthesia Postprocedure Evaluation (Signed)
Anesthesia Post Note  Patient: Kristine Nguyen  Procedure(s) Performed: LEFT BREAST LUMPECTOMY X 2 WITH RADIOACTIVE SEED LOCALIZATION (Left Breast)     Patient location during evaluation: PACU Anesthesia Type: General Level of consciousness: awake and alert, oriented and patient cooperative Pain management: pain level controlled Vital Signs Assessment: post-procedure vital signs reviewed and stable Respiratory status: spontaneous breathing, nonlabored ventilation and respiratory function stable Cardiovascular status: blood pressure returned to baseline and stable Postop Assessment: no apparent nausea or vomiting Anesthetic complications: no   No complications documented.  Last Vitals:  Vitals:   11/05/19 1522 11/05/19 1536  BP: 125/74 117/66  Pulse: (!) 54 63  Resp: 13 16  Temp: 36.7 C   SpO2: 100% 97%    Last Pain:  Vitals:   11/05/19 1536  TempSrc:   PainSc: [place]

## 2019-11-06 ENCOUNTER — Encounter (HOSPITAL_COMMUNITY): Payer: Self-pay | Admitting: Surgery

## 2019-11-08 ENCOUNTER — Institutional Professional Consult (permissible substitution): Payer: Medicare Other | Admitting: Radiation Oncology

## 2019-11-10 LAB — SURGICAL PATHOLOGY

## 2020-11-16 ENCOUNTER — Encounter (HOSPITAL_COMMUNITY): Payer: Self-pay

## 2022-05-23 IMAGING — MG MM BREAST BX W LOC DEV 1ST LESION IMAGE BX SPEC STEREO GUIDE*L*
6 of 9 series · 6 of 21 positions shown · non-contrast
Comparison: Previous exams.
COMPARISON: Previous exams.

Addendum:
CLINICAL DATA: 69-year-old female presenting for biopsy of
calcifications in the medial left breast.

EXAM:
LEFT BREAST STEREOTACTIC CORE NEEDLE BIOPSY

[L (1 of 6)]
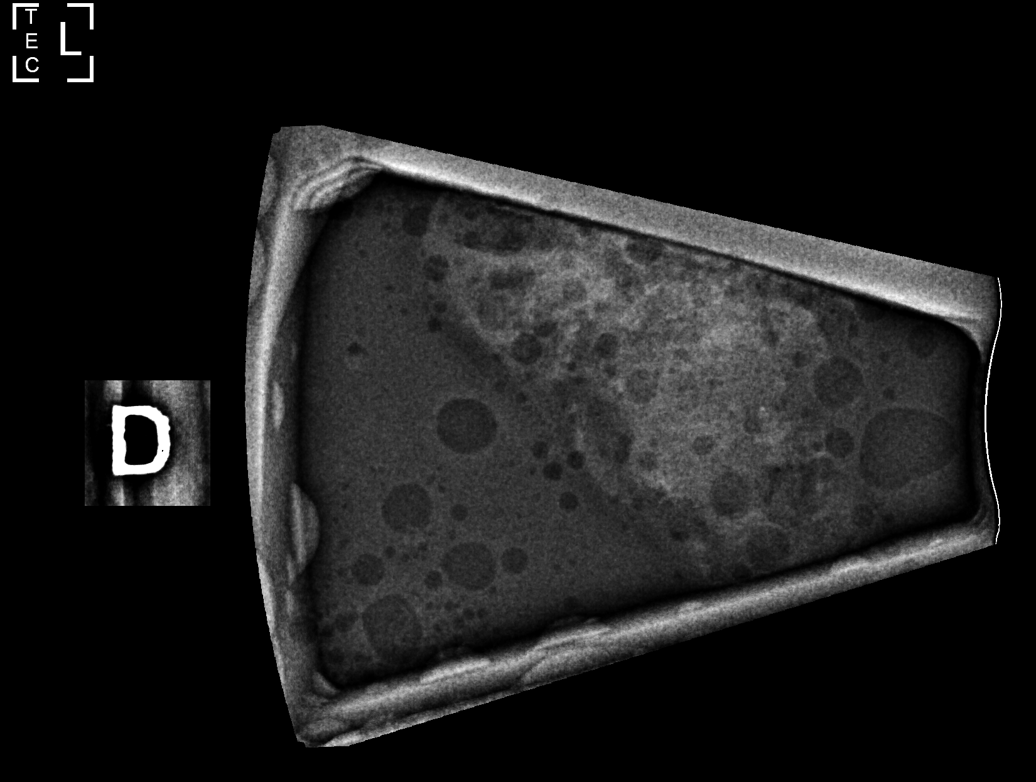

[L (2 of 6)]
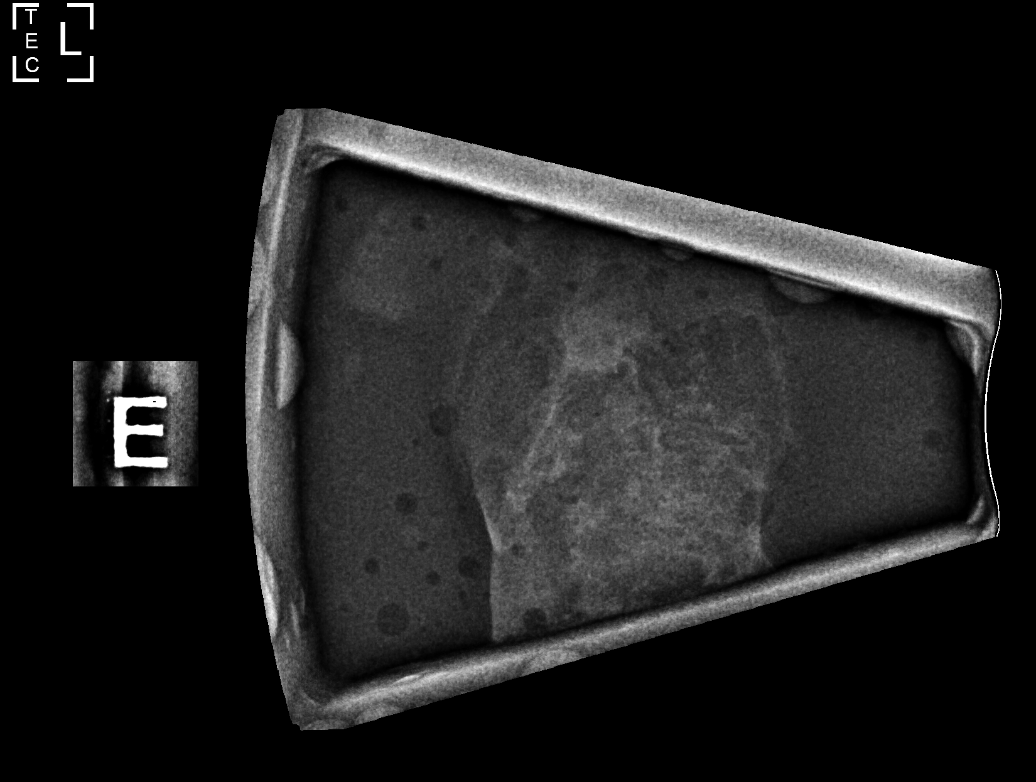

[L (3 of 6)]
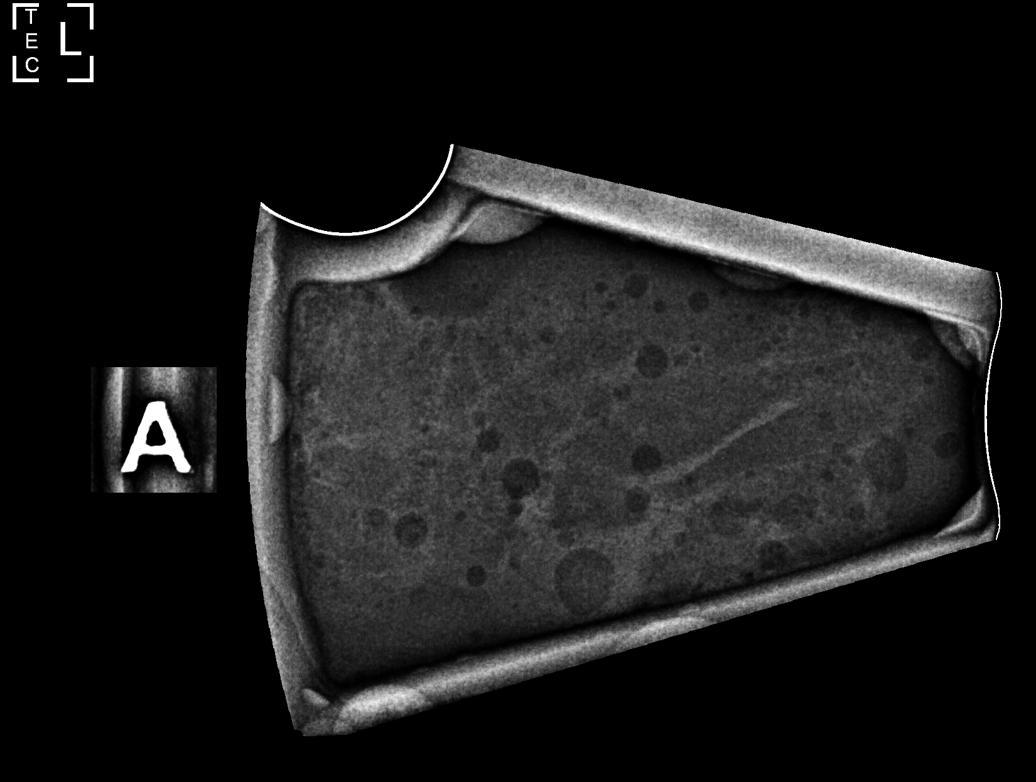

[L (4 of 6)]
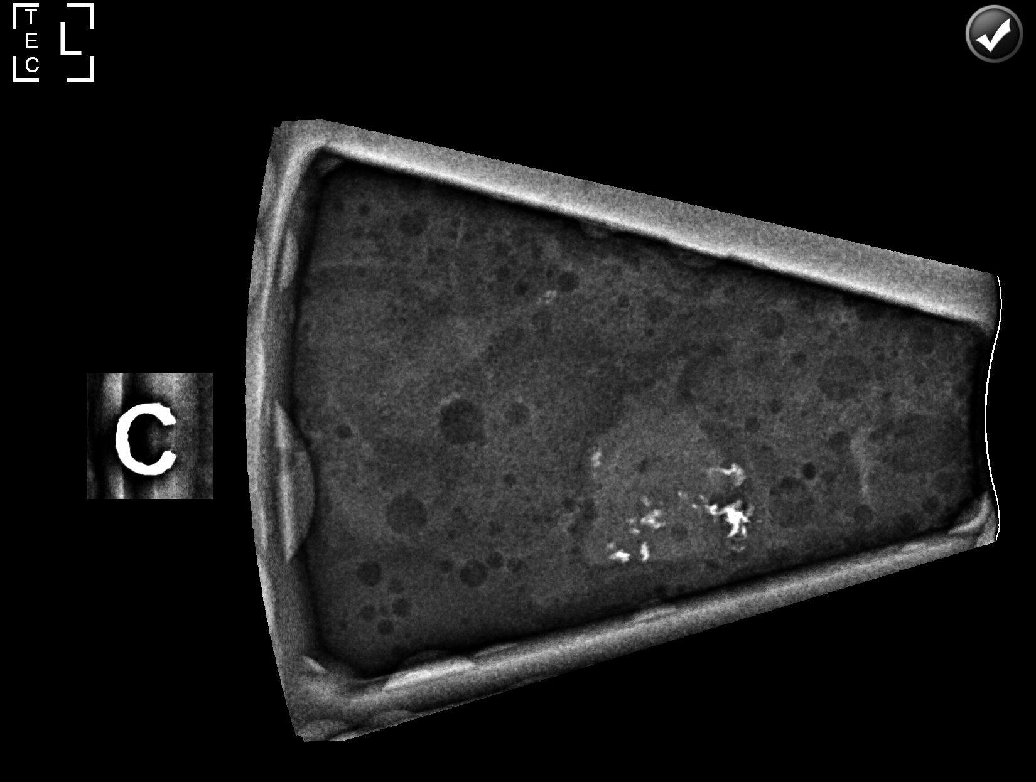

[L (5 of 6)]
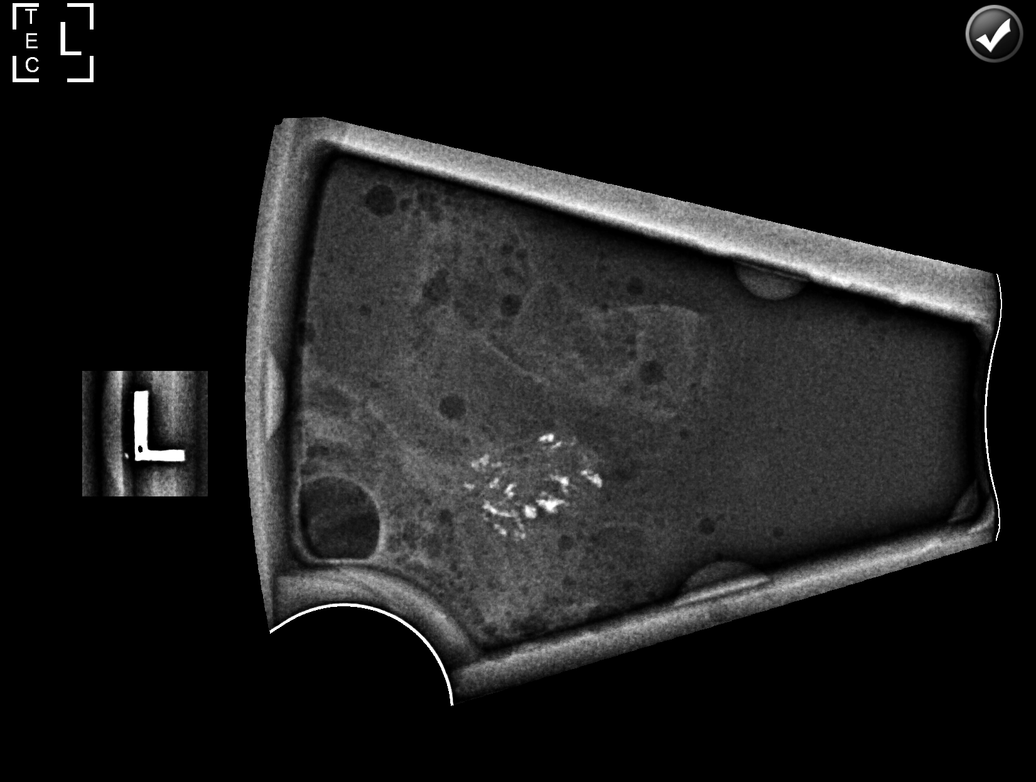

[L (6 of 6)]
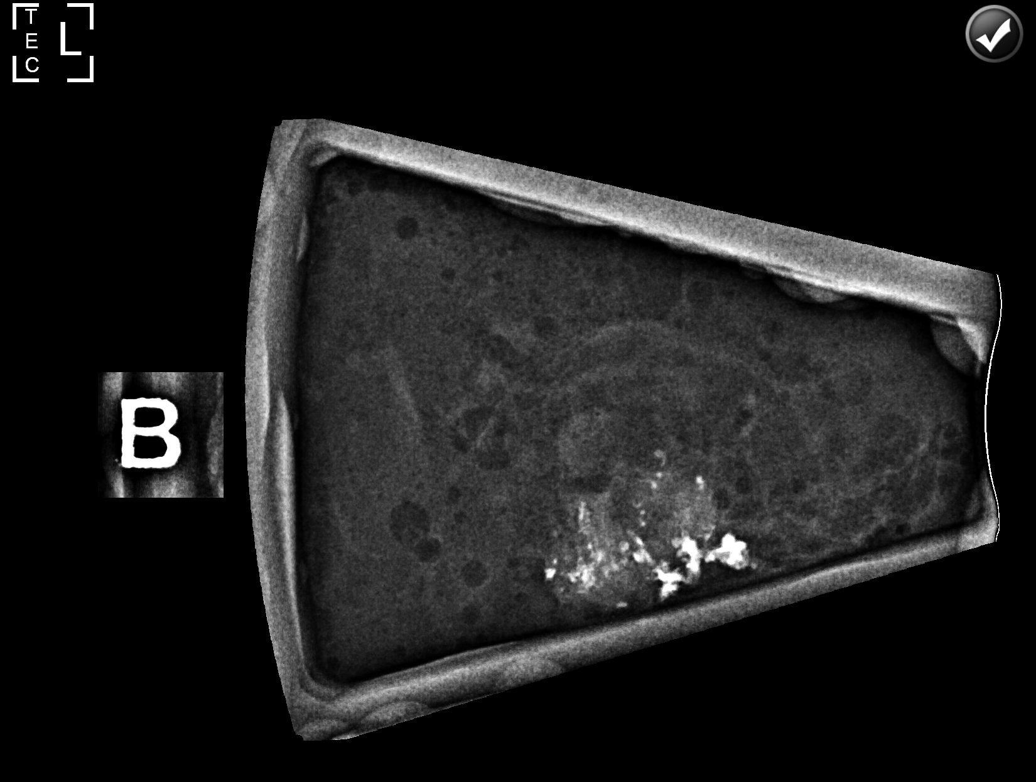

[6 of 21 positions shown; findings below may reference images not displayed]



Using sterile technique and 1% Lidocaine as local anesthetic, under
stereotactic guidance, a 9 gauge vacuum assisted device was used to
perform core needle biopsy of calcifications in the medial left
breast using a medial approach. Specimen radiograph was performed
showing calcifications in at least 3 specimens. Specimens with
calcifications are identified for pathology.

Lesion quadrant: Upper inner quadrant

At the conclusion of the procedure, a coil tissue marker clip was
deployed into the biopsy cavity. Follow-up 2-view mammogram was
performed and dictated separately.
IMPRESSION: Stereotactic-guided biopsy of calcifications in the medial left
breast. No apparent complications.

ADDENDUM:
Pathology revealed DUCTAL PAPILLOMA WITH USUAL DUCTAL HYPERPLASIA of
the LEFT breast, retroareolar, 6 o'clock. This was found to be
concordant by Dr. Dilek Bauer, with excision recommended.

Pathology revealed LOW GRADE DUCTAL CARCINOMA IN SITU WITH
CALCIFICATIONS, FIBROADENOMATOID CHANGE WITH CALCIFICATIONS of the
left breast, medial. This was found to be concordant by Dr. Culibrk
Yeny.

Pathology results were discussed with the patient by telephone. The
patient reported doing well after the biopsies with tenderness at
the sites. Post biopsy instructions and care were reviewed and
questions were answered. The patient was encouraged to call The

Per patient request, surgical consultation has been arranged with
Dr. Candido Almeida at [[email or web address]] on October 09, 2019.

Pathology results reported by Masadul Dadapeer RN on 10/04/2019.



Using sterile technique and 1% Lidocaine as local anesthetic, under
stereotactic guidance, a 9 gauge vacuum assisted device was used to
perform core needle biopsy of calcifications in the medial left
breast using a medial approach. Specimen radiograph was performed
showing calcifications in at least 3 specimens. Specimens with
calcifications are identified for pathology.

Lesion quadrant: Upper inner quadrant

At the conclusion of the procedure, a coil tissue marker clip was
deployed into the biopsy cavity. Follow-up 2-view mammogram was
performed and dictated separately.
IMPRESSION: Stereotactic-guided biopsy of calcifications in the medial left
breast. No apparent complications.

## 2022-06-26 IMAGING — MG MM BREAST SURGICAL SPECIMEN
3 series · 3 of 3 positions shown · non-contrast
Comparison: Previous exam(s).

CLINICAL DATA: Post left breast excision.

EXAM:
SPECIMEN RADIOGRAPH OF THE LEFT BREAST

[L (1 of 3)]
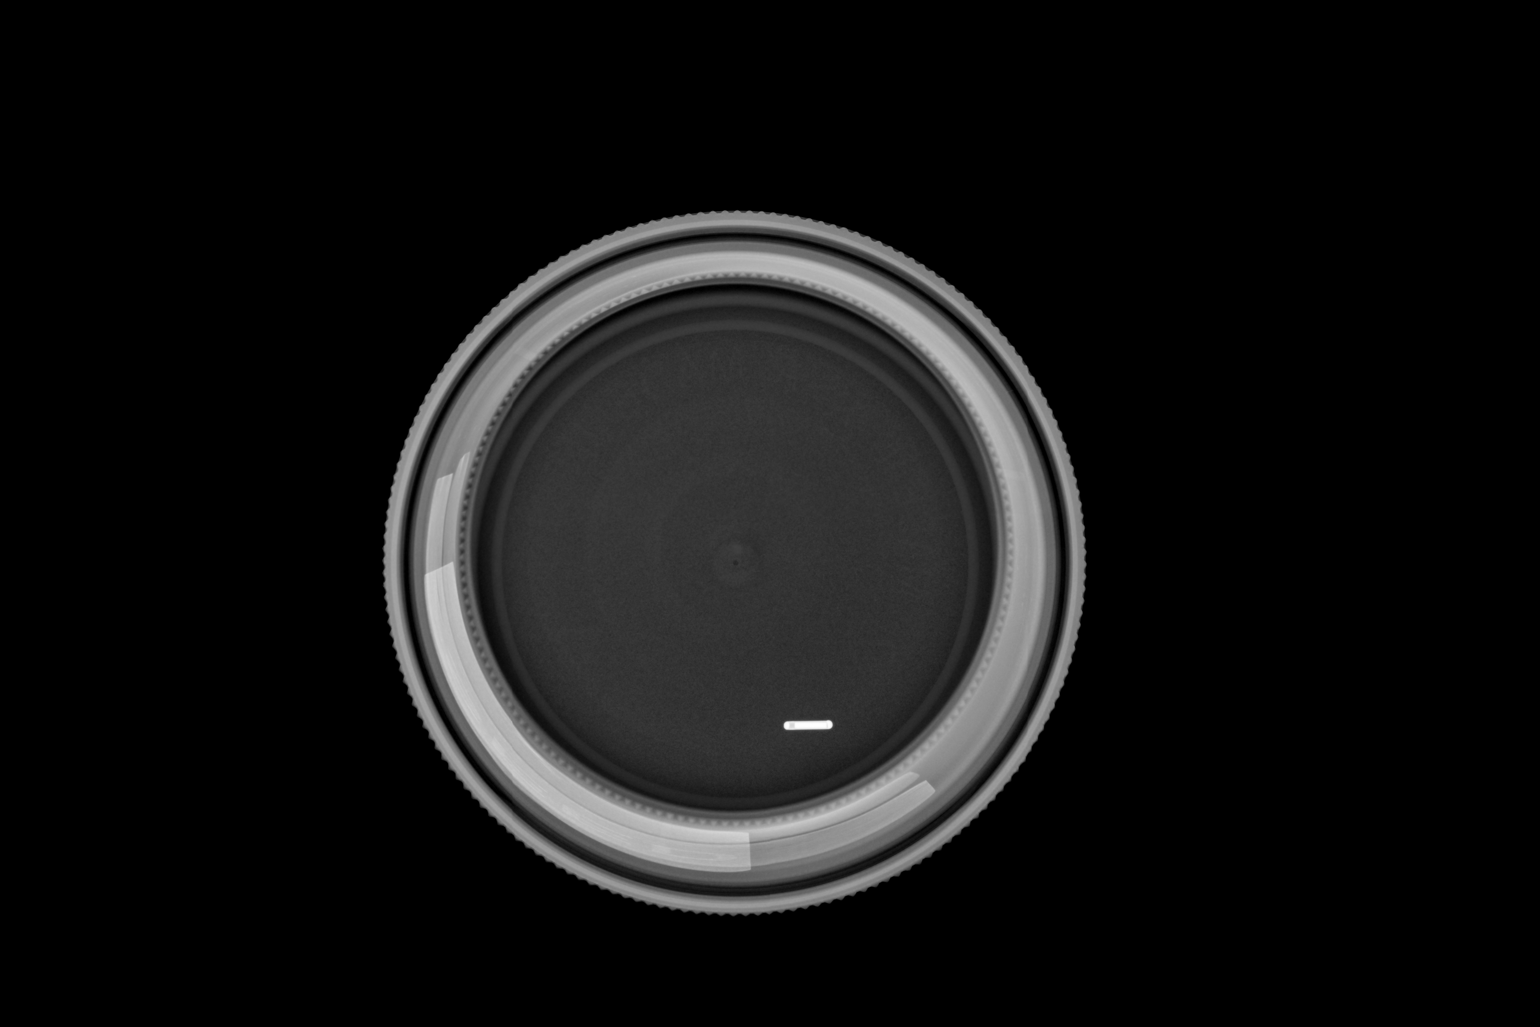

[L (2 of 3)]
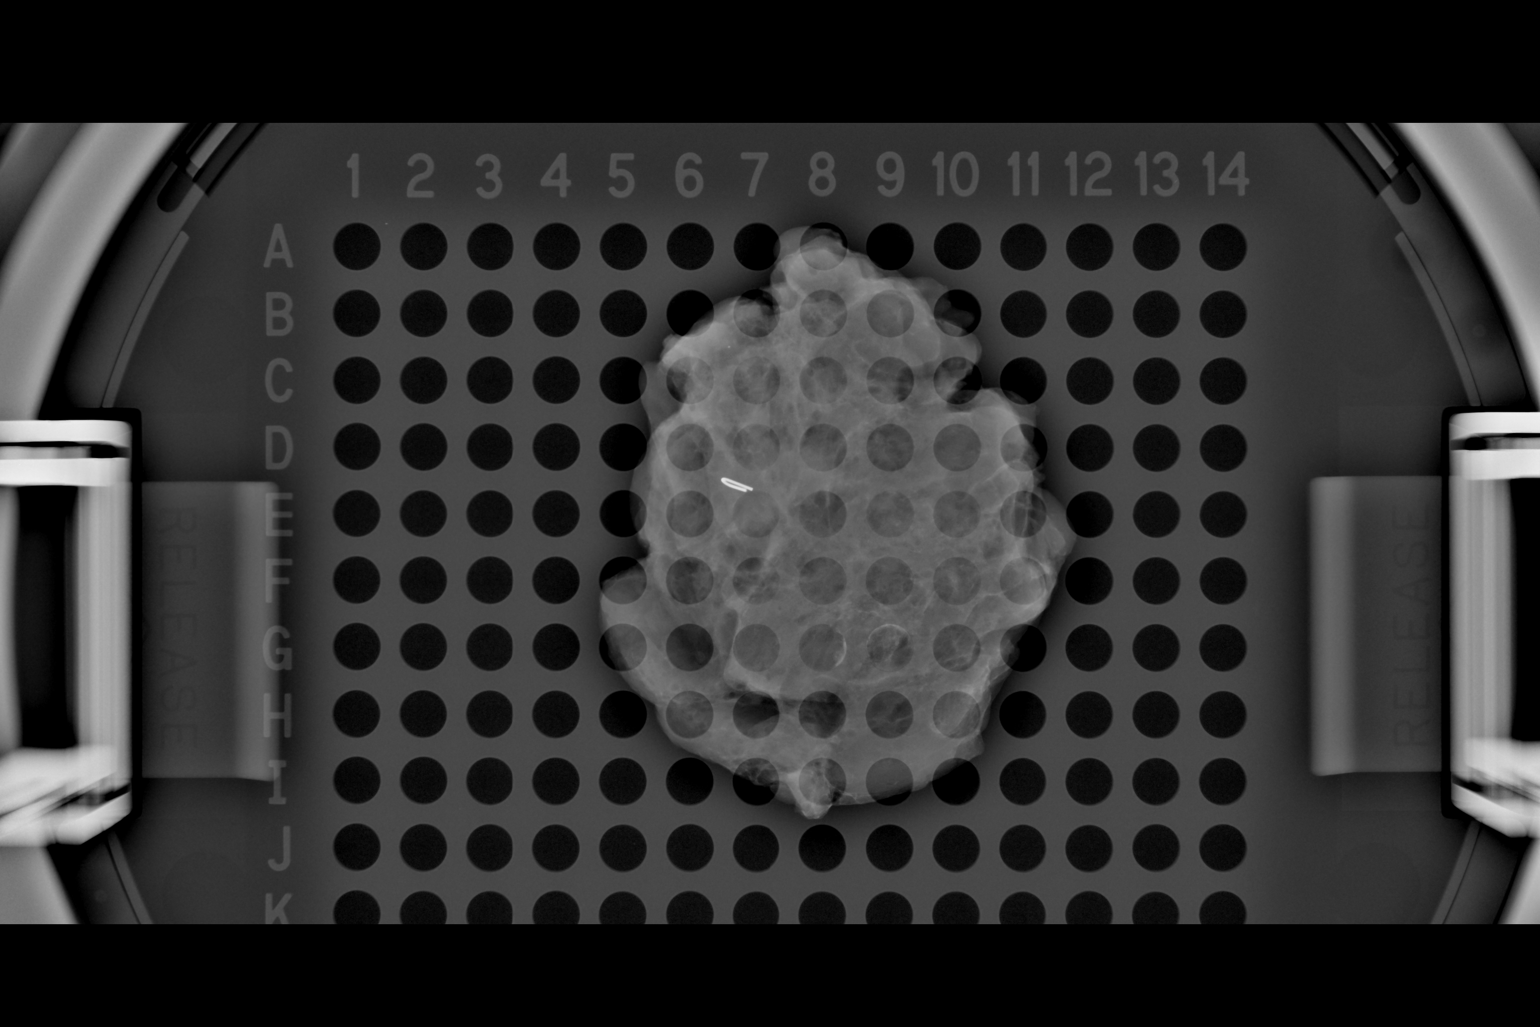

[L (3 of 3)]
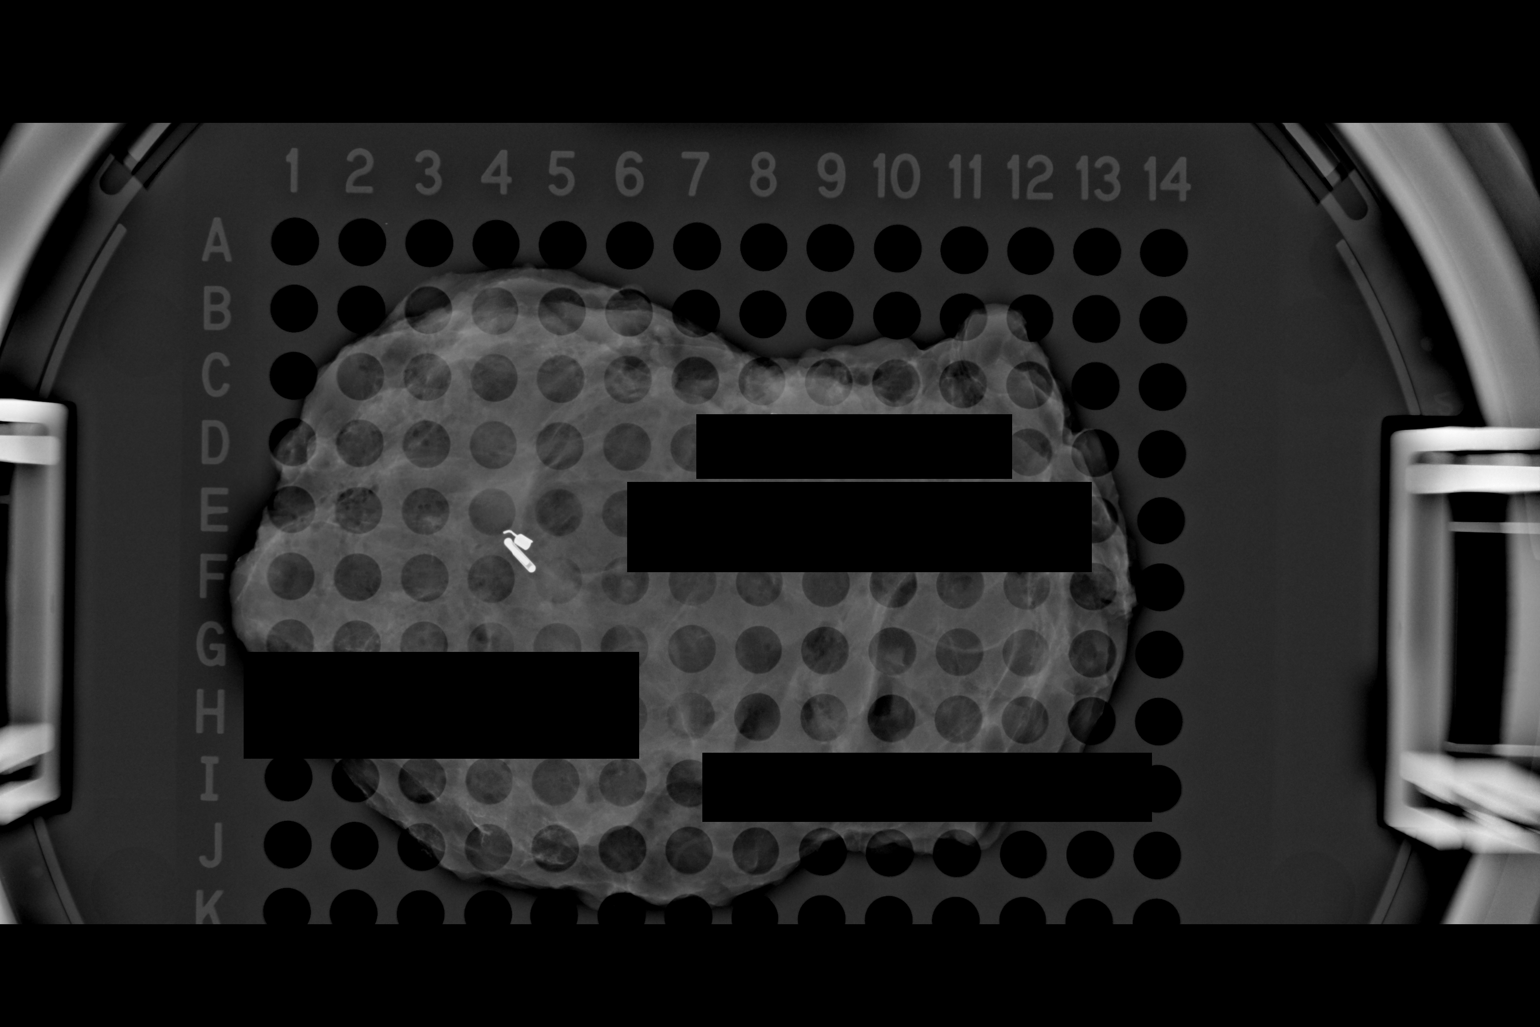

[3 of 3 positions shown; findings below may reference images not displayed]

FINDINGS: Status post excision of the left breast. The ribbon shaped biopsy
marker clip is present, completely intact, and marked for pathology.
The radioactive seed is present and submitted separately in a
specimen container.
IMPRESSION: Specimen radiograph of the left breast.
